# Patient Record
Sex: Male | Born: 1965 | Race: Black or African American | Hispanic: No | Marital: Single | Smoking: Former smoker
Health system: Southern US, Community
[De-identification: ages and names within clinical notes are randomized; demographics above are authoritative.]

## PROBLEM LIST (undated history)

## (undated) DIAGNOSIS — J439 Emphysema, unspecified: Secondary | ICD-10-CM

## (undated) DIAGNOSIS — A150 Tuberculosis of lung: Secondary | ICD-10-CM

---

## 2004-07-17 ENCOUNTER — Ambulatory Visit: Payer: Self-pay | Admitting: Cardiology

## 2016-09-10 ENCOUNTER — Emergency Department (HOSPITAL_COMMUNITY)
Admission: EM | Admit: 2016-09-10 | Discharge: 2016-09-11 | Disposition: A | Discharge status: Home / Self Care | Payer: Self-pay | Attending: Emergency Medicine | Admitting: Emergency Medicine

## 2016-09-10 ENCOUNTER — Emergency Department (HOSPITAL_COMMUNITY): Payer: Self-pay

## 2016-09-10 ENCOUNTER — Encounter (HOSPITAL_COMMUNITY): Payer: Self-pay | Admitting: Emergency Medicine

## 2016-09-10 DIAGNOSIS — Y999 Unspecified external cause status: Secondary | ICD-10-CM | POA: Insufficient documentation

## 2016-09-10 DIAGNOSIS — F17200 Nicotine dependence, unspecified, uncomplicated: Secondary | ICD-10-CM | POA: Insufficient documentation

## 2016-09-10 DIAGNOSIS — Y939 Activity, unspecified: Secondary | ICD-10-CM | POA: Insufficient documentation

## 2016-09-10 DIAGNOSIS — Y92410 Unspecified street and highway as the place of occurrence of the external cause: Secondary | ICD-10-CM | POA: Insufficient documentation

## 2016-09-10 DIAGNOSIS — S63502A Unspecified sprain of left wrist, initial encounter: Secondary | ICD-10-CM | POA: Insufficient documentation
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
## 2016-09-11 MED ORDER — IBUPROFEN 800 MG PO TABS
800.0000 mg | ORAL_TABLET | Freq: Once | ORAL | Status: AC
  Administered 2016-09-11: 800 mg via ORAL
  Filled 2016-09-11: qty 1

## 2016-09-11 NOTE — Discharge Instructions (Signed)
Please return to the Emergency Department for new or worsening symptoms. Please rest your wrist, apply ice, and wear your brace. You may also take anti-inflammatories such as ibuprofen for pain. Please call [facility] for follow up if pain persists.

## 2018-05-16 IMAGING — CR DG WRIST COMPLETE 3+V*L*
4 series · 4 of 4 positions shown · non-contrast
Comparison: None.

CLINICAL DATA: Initial evaluation for acute trauma, fall.

EXAM:
LEFT WRIST - COMPLETE 3+ VIEW

[wrist pa]
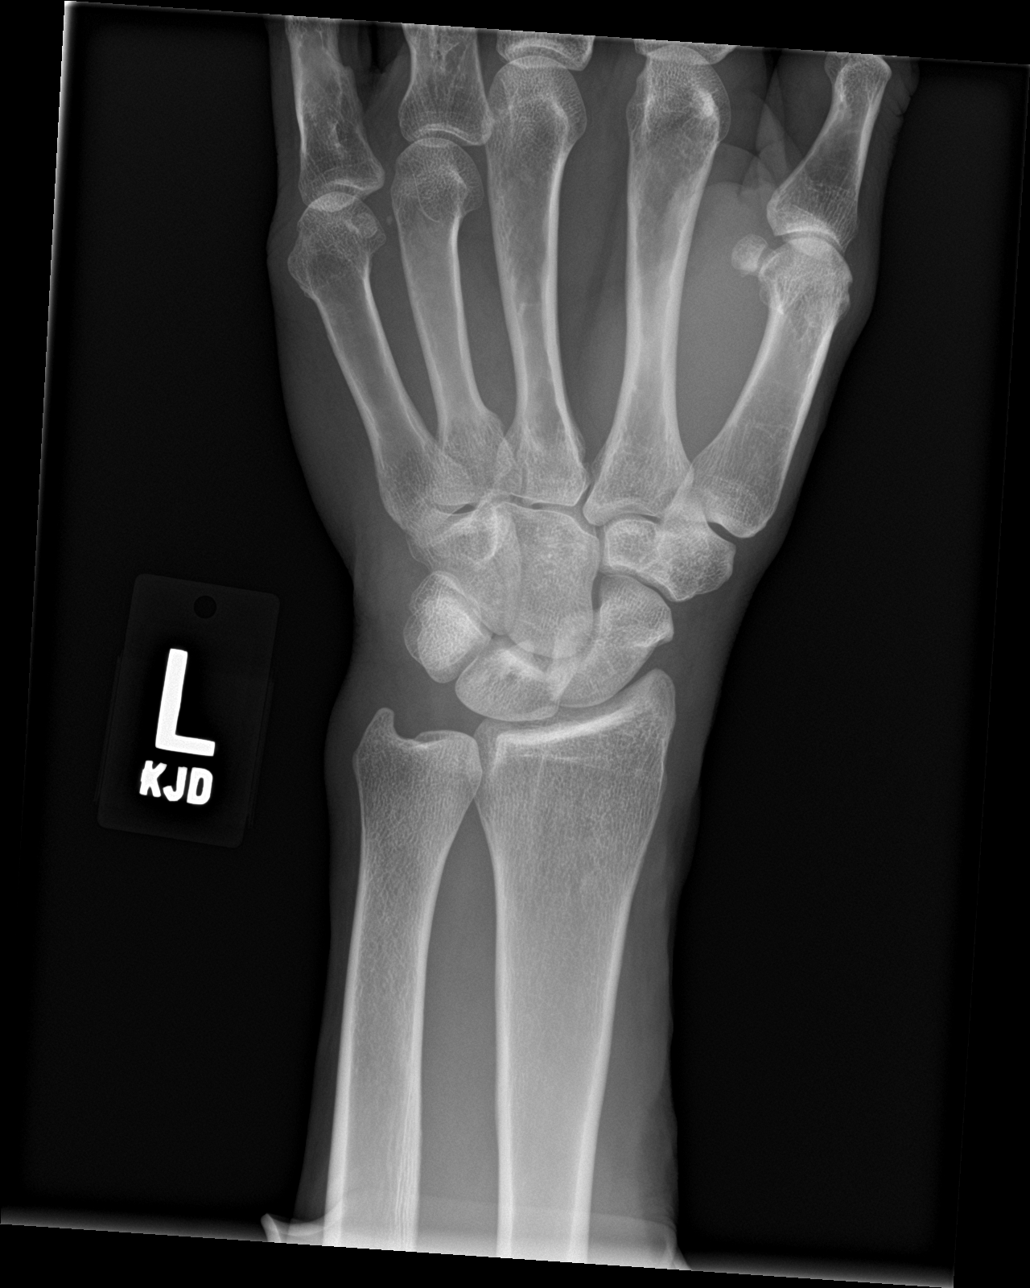

[wrist obl]
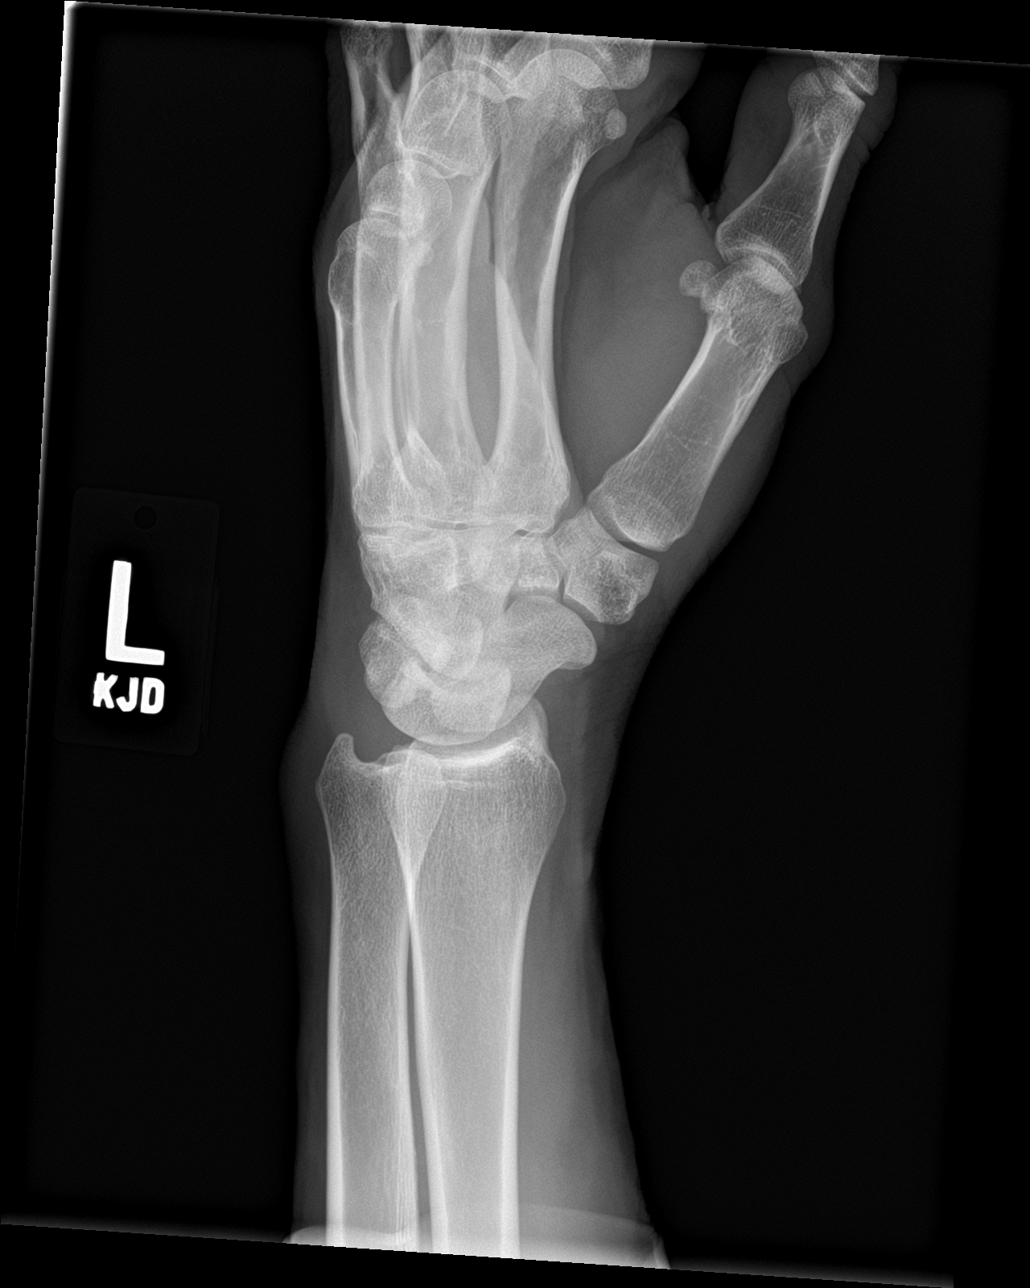

[wrist lat]
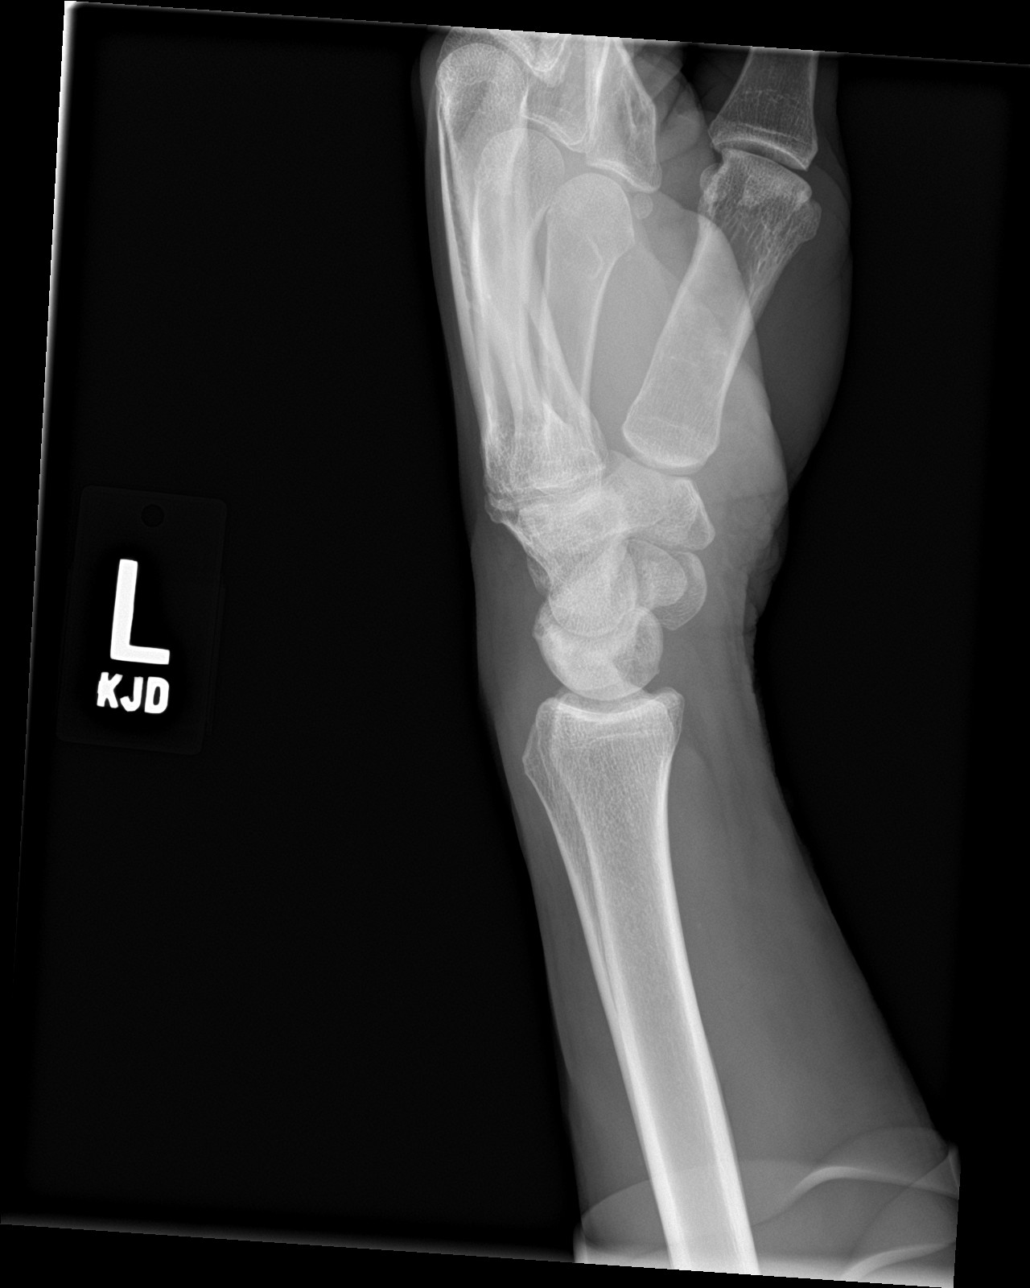

[wrist navicular]
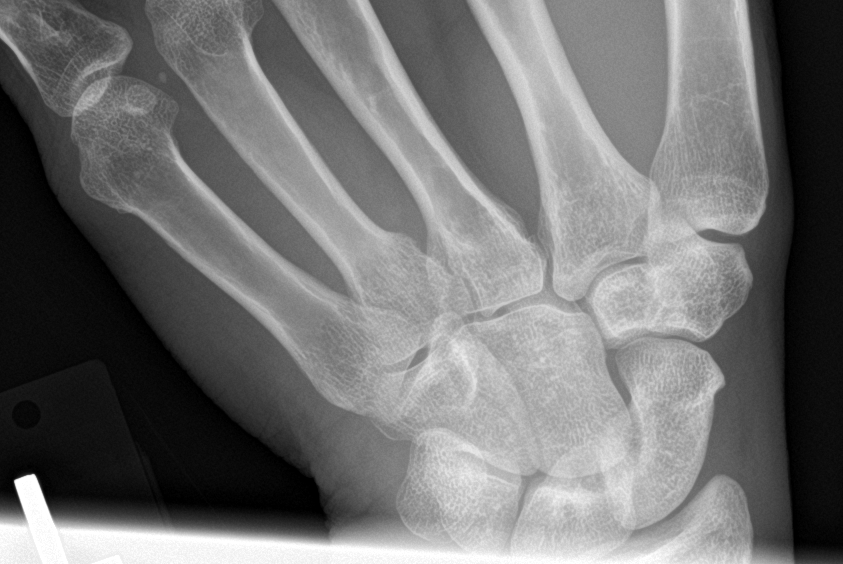

[4 of 4 positions shown; findings below may reference images not displayed]

FINDINGS: No acute fracture or dislocation. Normal distal radioulnar and
radiocarpal articulations intact. Osseous mineralization normal.
Mild focal soft tissue swelling at the distal ulna.
IMPRESSION: 1. No acute fracture or dislocation.
2. Mild soft tissue swelling overlying the distal ulna.

## 2020-11-27 ENCOUNTER — Encounter (HOSPITAL_COMMUNITY): Payer: Self-pay | Admitting: Emergency Medicine

## 2020-11-27 ENCOUNTER — Other Ambulatory Visit: Payer: Self-pay

## 2020-11-27 ENCOUNTER — Emergency Department (HOSPITAL_COMMUNITY): Payer: Self-pay

## 2020-11-27 ENCOUNTER — Emergency Department (HOSPITAL_COMMUNITY)
Admission: EM | Admit: 2020-11-27 | Discharge: 2020-11-28 | Disposition: A | Discharge status: Home / Self Care | Payer: Self-pay | Attending: Emergency Medicine | Admitting: Emergency Medicine

## 2020-11-27 DIAGNOSIS — F17200 Nicotine dependence, unspecified, uncomplicated: Secondary | ICD-10-CM | POA: Insufficient documentation

## 2020-11-27 DIAGNOSIS — R5383 Other fatigue: Secondary | ICD-10-CM

## 2020-11-27 DIAGNOSIS — J069 Acute upper respiratory infection, unspecified: Secondary | ICD-10-CM

## 2020-11-27 DIAGNOSIS — Z20822 Contact with and (suspected) exposure to covid-19: Secondary | ICD-10-CM | POA: Insufficient documentation

## 2020-11-27 LAB — RESP PANEL BY RT-PCR (FLU A&B, COVID) ARPGX2
Influenza A by PCR: NEGATIVE
Influenza B by PCR: NEGATIVE
SARS Coronavirus 2 by RT PCR: NEGATIVE

## 2020-11-27 MED ORDER — ACETAMINOPHEN 500 MG PO TABS
1000.0000 mg | ORAL_TABLET | Freq: Once | ORAL | Status: AC
  Administered 2020-11-28: 1000 mg via ORAL
  Filled 2020-11-27: qty 2

## 2020-11-27 MED ORDER — ALBUTEROL SULFATE HFA 108 (90 BASE) MCG/ACT IN AERS
4.0000 | INHALATION_SPRAY | Freq: Once | RESPIRATORY_TRACT | Status: AC
  Administered 2020-11-28: 4 via RESPIRATORY_TRACT
  Filled 2020-11-27: qty 6.7

## 2020-11-27 MED ORDER — DOXYCYCLINE HYCLATE 100 MG PO TABS
100.0000 mg | ORAL_TABLET | Freq: Once | ORAL | Status: AC
  Administered 2020-11-28: 100 mg via ORAL
  Filled 2020-11-27: qty 1

## 2020-11-27 NOTE — ED Triage Notes (Signed)
Patient brought in ambulatory by [facility] c/o general malaise, loss of appetite and cough x 3 days. Given 1 gram tylenol PTA. Reports negative home covid test.

## 2020-11-27 NOTE — ED Provider Notes (Signed)
Emergency Medicine Provider Triage Evaluation Note  Kenneth Hamilton , a 55 y.o. male  was evaluated in triage.  Pt complains of fatigue, cough and decreased appetite.  Symptoms have been going on for few days.  Could not tolerate his chicken yesterday.  Review of Systems  Positive: Fatigue, cough, fever Negative: Chest pain  Physical Exam  BP 139/84   Pulse (!) 112   Temp (!) 100.8 F (38.2 C) (Oral)   Resp 16   Ht 6' (1.829 m)   Wt 72.6 kg   SpO2 97%   BMI 21.70 kg/m  Gen:   Awake, no distress   Resp:  Normal effort  MSK:   Moves extremities without difficulty  Other:  Speaking complete sentences without signs of respiratory distress  Medical Decision Making  Medically screening exam initiated at 6:56 PM.  Appropriate orders placed.  Kenneth Hamilton was informed that the remainder of the evaluation will be completed by another provider, this initial triage assessment does not replace that evaluation, and the importance of remaining in the ED until their evaluation is complete.  Febrile here but given Tylenol by EMS   [name], PA-C 11/27/20 1859    Koleen Distance, MD 11/27/20 [phone]

## 2020-11-27 NOTE — ED Provider Notes (Signed)
[facility] EMERGENCY DEPARTMENT Provider Note   CSN: [id number] Arrival date & time: 11/27/20  1851     History Chief Complaint  Patient presents with   Fatigue    Kenneth Hamilton is a 55 y.o. male.  55 yo M here with fatigue and fever with cough for last 3 days. Generalized weakness as well. Somewhat productive cough. No known sick contacts. Does smoke. No significant urinary symptoms. Decreased PO but is hungry now. Has abdominal discomfort associated with the hunger but no other abdominal pain. Chest hurts with cough only.        History reviewed. No pertinent past medical history.  There are no problems to display for this patient.   History reviewed. No pertinent surgical history.     No family history on file.  Social History   Tobacco Use   Smoking status: Some Days    Pack years: 0.00   Smokeless tobacco: Never  Substance Use Topics   Alcohol use: No   Drug use: Yes    Types: Cocaine    Home Medications Prior to Admission medications   Medication Sig Start Date End Date Taking? Authorizing Provider  doxycycline (VIBRAMYCIN) 100 MG capsule Take 1 capsule (100 mg total) by mouth 2 (two) times daily. One po bid x 7 days 11/28/20  Yes Sumaiyah Markert, Barbara Cower, MD    Allergies    Patient has no known allergies.  Review of Systems   Review of Systems  Physical Exam Updated Vital Signs BP 131/85 (BP Location: Left Arm)   Pulse 60   Temp 98.9 F (37.2 C) (Oral)   Resp 16   Ht 6' (1.829 m)   Wt 72.6 kg   SpO2 96%   BMI 21.70 kg/m   Physical Exam  ED Results / Procedures / Treatments   Labs (all labs ordered are listed, but only abnormal results are displayed) Labs Reviewed  COMPREHENSIVE METABOLIC PANEL - Abnormal; Notable for the following components:      Result Value   CO2 20 (*)    Glucose, Bld 122 (*)    Calcium 8.6 (*)    All other components within normal limits  RESP PANEL BY RT-PCR (FLU A&B, COVID) ARPGX2  CBC WITH  DIFFERENTIAL/PLATELET  URINALYSIS, ROUTINE W REFLEX MICROSCOPIC    EKG None  Radiology DG Chest Portable 1 View  Result Date: 11/27/2020 CLINICAL DATA:  Fatigue, cough, and decreased appetite for a few days. EXAM: PORTABLE CHEST 1 VIEW COMPARISON:  None. FINDINGS: Heart size and pulmonary vascularity are normal. Emphysematous changes in the lungs. No airspace disease or consolidation. No pleural effusions. No pneumothorax. Mediastinal contours appear intact. IMPRESSION: Emphysematous changes in the lungs. No evidence of active pulmonary disease. Electronically Signed   By: Burman Nieves M.D.   On: 11/27/2020 20:27    Procedures Procedures   Medications Ordered in ED Medications  acetaminophen (TYLENOL) tablet 1,000 mg (1,000 mg Oral Given 11/28/20 0044)  albuterol (VENTOLIN HFA) 108 (90 Base) MCG/ACT inhaler 4 puff (4 puffs Inhalation Given 11/28/20 0011)  doxycycline (VIBRA-TABS) tablet 100 mg (100 mg Oral Given 11/28/20 0044)    ED Course  I have reviewed the triage vital signs and the nursing notes.  Pertinent labs & imaging results that were available during my care of the patient were reviewed by me and considered in my medical decision making (see chart for details).    MDM Rules/Calculators/A&P  Suspect some type of viral illness but does have productive cough and fever for a few days with emphysema. Cough somewhat improved with albuterol. Abx started. No hypoxia, resp distress or e/o sepsis to suggest need for further evaluation or hospital admission.   Final Clinical Impression(s) / ED Diagnoses Final diagnoses:  Other fatigue  Upper respiratory tract infection, unspecified type    Rx / DC Orders ED Discharge Orders          Ordered    doxycycline (VIBRAMYCIN) 100 MG capsule  2 times daily        11/28/20 0343             Demetries Coia, Barbara Cower, MD 11/28/20 [phone]

## 2020-11-28 LAB — CBC WITH DIFFERENTIAL/PLATELET
Abs Immature Granulocytes: 0.02 10*3/uL (ref 0.00–0.07)
Basophils Absolute: 0 10*3/uL (ref 0.0–0.1)
Basophils Relative: 1 %
Eosinophils Absolute: 0 10*3/uL (ref 0.0–0.5)
Eosinophils Relative: 1 %
HCT: 49.8 % (ref 39.0–52.0)
Hemoglobin: 17 g/dL (ref 13.0–17.0)
Immature Granulocytes: 0 %
Lymphocytes Relative: 22 %
Lymphs Abs: 1.4 10*3/uL (ref 0.7–4.0)
MCH: 30 pg (ref 26.0–34.0)
MCHC: 34.1 g/dL (ref 30.0–36.0)
MCV: 87.8 fL (ref 80.0–100.0)
Monocytes Absolute: 0.7 10*3/uL (ref 0.1–1.0)
Monocytes Relative: 12 %
Neutro Abs: 4.1 10*3/uL (ref 1.7–7.7)
Neutrophils Relative %: 64 %
Platelets: UNDETERMINED 10*3/uL (ref 150–400)
RBC: 5.67 MIL/uL (ref 4.22–5.81)
RDW: 13.7 % (ref 11.5–15.5)
WBC: 6.4 10*3/uL (ref 4.0–10.5)
nRBC: 0 % (ref 0.0–0.2)

## 2020-11-28 LAB — COMPREHENSIVE METABOLIC PANEL
ALT: 21 U/L (ref 0–44)
AST: 35 U/L (ref 15–41)
Albumin: 3.6 g/dL (ref 3.5–5.0)
Alkaline Phosphatase: 38 U/L (ref 38–126)
Anion gap: 10 (ref 5–15)
BUN: 9 mg/dL (ref 6–20)
CO2: 20 mmol/L — ABNORMAL LOW (ref 22–32)
Calcium: 8.6 mg/dL — ABNORMAL LOW (ref 8.9–10.3)
Chloride: 105 mmol/L (ref 98–111)
Creatinine, Ser: 1.24 mg/dL (ref 0.61–1.24)
GFR, Estimated: >60 mL/min (ref >60)
Glucose, Bld: 122 mg/dL — ABNORMAL HIGH (ref 70–99)
Potassium: 4.1 mmol/L (ref 3.5–5.1)
Sodium: 135 mmol/L (ref 135–145)
Total Bilirubin: 0.6 mg/dL (ref 0.3–1.2)
Total Protein: 6.8 g/dL (ref 6.5–8.1)

## 2020-11-28 MED ORDER — DOXYCYCLINE HYCLATE 100 MG PO CAPS: 100.0000 mg | ORAL_CAPSULE | Freq: Two times a day (BID) | ORAL | 0 refills | Status: DC

## 2020-11-28 NOTE — ED Notes (Signed)
Patient verbalizes understanding of discharge instructions. Prescriptions reviewed. Opportunity for questioning and answers were provided. Armband removed by staff, pt discharged from ED ambulatory. Bus pass provided.

## 2020-11-30 ENCOUNTER — Emergency Department (HOSPITAL_COMMUNITY): Payer: Self-pay

## 2020-11-30 ENCOUNTER — Inpatient Hospital Stay (HOSPITAL_COMMUNITY)
Admission: EM | Admit: 2020-11-30 | Discharge: 2020-12-02 | DRG: 871 | Disposition: A | Discharge status: Home / Self Care | Payer: Self-pay | Attending: Family Medicine | Admitting: Family Medicine

## 2020-11-30 ENCOUNTER — Encounter (HOSPITAL_COMMUNITY): Payer: Self-pay | Admitting: Emergency Medicine

## 2020-11-30 ENCOUNTER — Other Ambulatory Visit: Payer: Self-pay

## 2020-11-30 DIAGNOSIS — Z79899 Other long term (current) drug therapy: Secondary | ICD-10-CM

## 2020-11-30 DIAGNOSIS — J189 Pneumonia, unspecified organism: Secondary | ICD-10-CM | POA: Diagnosis present

## 2020-11-30 DIAGNOSIS — Z8611 Personal history of tuberculosis: Secondary | ICD-10-CM

## 2020-11-30 DIAGNOSIS — R630 Anorexia: Secondary | ICD-10-CM | POA: Diagnosis present

## 2020-11-30 DIAGNOSIS — Z20822 Contact with and (suspected) exposure to covid-19: Secondary | ICD-10-CM | POA: Diagnosis present

## 2020-11-30 DIAGNOSIS — A419 Sepsis, unspecified organism: Principal | ICD-10-CM | POA: Diagnosis present

## 2020-11-30 DIAGNOSIS — Z6821 Body mass index (BMI) 21.0-21.9, adult: Secondary | ICD-10-CM

## 2020-11-30 DIAGNOSIS — J439 Emphysema, unspecified: Secondary | ICD-10-CM

## 2020-11-30 DIAGNOSIS — F17200 Nicotine dependence, unspecified, uncomplicated: Secondary | ICD-10-CM | POA: Diagnosis present

## 2020-11-30 DIAGNOSIS — Z8249 Family history of ischemic heart disease and other diseases of the circulatory system: Secondary | ICD-10-CM

## 2020-11-30 DIAGNOSIS — Z833 Family history of diabetes mellitus: Secondary | ICD-10-CM

## 2020-11-30 HISTORY — DX: Tuberculosis of lung: A15.0

## 2020-11-30 HISTORY — DX: Pneumonia, unspecified organism: J18.9

## 2020-11-30 HISTORY — DX: Emphysema, unspecified: J43.9

## 2020-11-30 LAB — CBC WITH DIFFERENTIAL/PLATELET
Abs Immature Granulocytes: 0.05 10*3/uL (ref 0.00–0.07)
Basophils Absolute: 0 10*3/uL (ref 0.0–0.1)
Basophils Relative: 0 %
Eosinophils Absolute: 0 10*3/uL (ref 0.0–0.5)
Eosinophils Relative: 0 %
HCT: 46.8 % (ref 39.0–52.0)
Hemoglobin: 16.1 g/dL (ref 13.0–17.0)
Immature Granulocytes: 0 %
Lymphocytes Relative: 7 %
Lymphs Abs: 0.8 10*3/uL (ref 0.7–4.0)
MCH: 29.6 pg (ref 26.0–34.0)
MCHC: 34.4 g/dL (ref 30.0–36.0)
MCV: 86 fL (ref 80.0–100.0)
Monocytes Absolute: 0.8 10*3/uL (ref 0.1–1.0)
Monocytes Relative: 7 %
Neutro Abs: 9.8 10*3/uL — ABNORMAL HIGH (ref 1.7–7.7)
Neutrophils Relative %: 86 %
Platelets: 153 10*3/uL (ref 150–400)
RBC: 5.44 MIL/uL (ref 4.22–5.81)
RDW: 13.4 % (ref 11.5–15.5)
WBC: 11.5 10*3/uL — ABNORMAL HIGH (ref 4.0–10.5)
nRBC: 0 % (ref 0.0–0.2)

## 2020-11-30 LAB — URINALYSIS, ROUTINE W REFLEX MICROSCOPIC
Bilirubin Urine: NEGATIVE
Glucose, UA: NEGATIVE mg/dL
Ketones, ur: NEGATIVE mg/dL
Nitrite: NEGATIVE
Protein, ur: NEGATIVE mg/dL
Specific Gravity, Urine: 1.015 (ref 1.005–1.030)
pH: 5 (ref 5.0–8.0)

## 2020-11-30 LAB — PROCALCITONIN: Procalcitonin: 1.13 ng/mL

## 2020-11-30 LAB — COMPREHENSIVE METABOLIC PANEL
ALT: 29 U/L (ref 0–44)
AST: 40 U/L (ref 15–41)
Albumin: 3.4 g/dL — ABNORMAL LOW (ref 3.5–5.0)
Alkaline Phosphatase: 42 U/L (ref 38–126)
Anion gap: 9 (ref 5–15)
BUN: 15 mg/dL (ref 6–20)
CO2: 26 mmol/L (ref 22–32)
Calcium: 9 mg/dL (ref 8.9–10.3)
Chloride: 103 mmol/L (ref 98–111)
Creatinine, Ser: 1.26 mg/dL — ABNORMAL HIGH (ref 0.61–1.24)
GFR, Estimated: >60 mL/min (ref >60)
Glucose, Bld: 112 mg/dL — ABNORMAL HIGH (ref 70–99)
Potassium: 4.1 mmol/L (ref 3.5–5.1)
Sodium: 138 mmol/L (ref 135–145)
Total Bilirubin: 0.8 mg/dL (ref 0.3–1.2)
Total Protein: 7.4 g/dL (ref 6.5–8.1)

## 2020-11-30 LAB — HIV ANTIBODY (ROUTINE TESTING W REFLEX): HIV Screen 4th Generation wRfx: NONREACTIVE

## 2020-11-30 LAB — RESP PANEL BY RT-PCR (FLU A&B, COVID) ARPGX2
Influenza A by PCR: NEGATIVE
Influenza B by PCR: NEGATIVE
SARS Coronavirus 2 by RT PCR: NEGATIVE

## 2020-11-30 LAB — LACTIC ACID, PLASMA
Lactic Acid, Venous: 2 mmol/L (ref 0.5–1.9)
Lactic Acid, Venous: 2 mmol/L (ref 0.5–1.9)

## 2020-11-30 MED ORDER — SODIUM CHLORIDE 0.9 % IV SOLN
500.0000 mg | Freq: Once | INTRAVENOUS | Status: AC
  Administered 2020-11-30: 500 mg via INTRAVENOUS
  Filled 2020-11-30: qty 500

## 2020-11-30 MED ORDER — SODIUM CHLORIDE 0.9 % IV SOLN
2.0000 g | INTRAVENOUS | Status: DC
  Administered 2020-12-01: 2 g via INTRAVENOUS
  Filled 2020-11-30: qty 2
  Filled 2020-11-30 (×2): qty 20

## 2020-11-30 MED ORDER — SODIUM CHLORIDE 0.45 % IV SOLN: INTRAVENOUS | Status: DC

## 2020-11-30 MED ORDER — METOPROLOL TARTRATE 5 MG/5ML IV SOLN: 2.5000 mg | INTRAVENOUS | Status: DC | PRN

## 2020-11-30 MED ORDER — SODIUM CHLORIDE 0.9 % IV SOLN
1.0000 g | Freq: Once | INTRAVENOUS | Status: AC
  Administered 2020-11-30: 1 g via INTRAVENOUS
  Filled 2020-11-30: qty 10

## 2020-11-30 MED ORDER — SODIUM CHLORIDE 0.9 % IV BOLUS
500.0000 mL | Freq: Once | INTRAVENOUS | Status: AC
  Administered 2020-11-30: 500 mL via INTRAVENOUS

## 2020-11-30 MED ORDER — ACETAMINOPHEN 160 MG/5ML PO SOLN: 320.0000 mg | Freq: Four times a day (QID) | ORAL | Status: DC | PRN

## 2020-11-30 MED ORDER — AZITHROMYCIN 250 MG PO TABS
500.0000 mg | ORAL_TABLET | Freq: Every day | ORAL | Status: DC
  Administered 2020-12-01 – 2020-12-02 (×2): 500 mg via ORAL
  Filled 2020-11-30 (×2): qty 2

## 2020-11-30 MED ORDER — ALBUTEROL SULFATE (2.5 MG/3ML) 0.083% IN NEBU: 2.5000 mg | INHALATION_SOLUTION | Freq: Four times a day (QID) | RESPIRATORY_TRACT | Status: DC | PRN

## 2020-11-30 MED ORDER — ENOXAPARIN SODIUM 40 MG/0.4ML IJ SOSY
40.0000 mg | PREFILLED_SYRINGE | INTRAMUSCULAR | Status: DC
  Administered 2020-11-30 – 2020-12-01 (×2): 40 mg via SUBCUTANEOUS
  Filled 2020-11-30 (×2): qty 0.4

## 2020-11-30 MED ORDER — ACETAMINOPHEN 325 MG PO TABS
650.0000 mg | ORAL_TABLET | Freq: Once | ORAL | Status: AC | PRN
  Administered 2020-11-30: 650 mg via ORAL
  Filled 2020-11-30: qty 2

## 2020-11-30 MED ORDER — NICOTINE 14 MG/24HR TD PT24
14.0000 mg | MEDICATED_PATCH | Freq: Every day | TRANSDERMAL | Status: DC
  Administered 2020-11-30 – 2020-12-02 (×3): 14 mg via TRANSDERMAL
  Filled 2020-11-30 (×3): qty 1

## 2020-11-30 MED ORDER — GUAIFENESIN-DM 100-10 MG/5ML PO SYRP
5.0000 mL | ORAL_SOLUTION | ORAL | Status: DC | PRN
  Administered 2020-11-30: 5 mL via ORAL
  Filled 2020-11-30: qty 5

## 2020-11-30 NOTE — ED Notes (Signed)
Pt ambulated 4x in room with an O2 96%

## 2020-11-30 NOTE — ED Provider Notes (Signed)
[facility] Provider Note   CSN: [id number] Arrival date & time: 11/30/20  1635     History No chief complaint on file.   Kenneth Hamilton is a 55 y.o. male.  Patient is a 55 year old male who presents with cough and fever.  He states he has had a cough and fever for about the last 4 to 5 days.  He was seen here 3 days ago for similar symptoms.  He has a cough which is productive of phlegm and fevers associated with generalized weakness.  He has had some nausea but no vomiting.  Said a little bit of loose stools.  He reports some shortness of breath after ambulation.  On his prior visit, he was given a prescription for doxycycline and albuterol inhaler.  He has been using albuterol inhaler but he said when he went to get the prescription the pharmacy was closed and he has not been able to get the doxycycline.  He says overall he has been feeling worse and has diffuse generalized weakness.  He said he is sleeping on a blowup mattress because it is closer to the bathroom he does not have to walk as far.  He can only walk short distances.  He does not have much of an appetite.  He has a smoker but has not smoked since this illness started.      History reviewed. No pertinent past medical history.  There are no problems to display for this patient.   History reviewed. No pertinent surgical history.     No family history on file.  Social History   Tobacco Use   Smoking status: Some Days    Pack years: 0.00   Smokeless tobacco: Never  Substance Use Topics   Alcohol use: No   Drug use: Yes    Types: Cocaine    Home Medications Prior to Admission medications   Medication Sig Start Date End Date Taking? Authorizing Provider  acetaminophen (TYLENOL) 160 MG/5ML solution Take 320 mg by mouth every 6 (six) hours as needed for fever.   Yes [provider]  albuterol (VENTOLIN HFA) 108 (90 Base) MCG/ACT inhaler Inhale 2 puffs into the  lungs every 6 (six) hours as needed for wheezing or shortness of breath.   Yes [provider]  doxycycline (VIBRAMYCIN) 100 MG capsule Take 1 capsule (100 mg total) by mouth 2 (two) times daily. One po bid x 7 days 11/28/20   Mesner, Barbara Cower, MD    Allergies    Patient has no known allergies.  Review of Systems   Review of Systems  Constitutional:  Positive for fatigue and fever. Negative for chills and diaphoresis.  HENT:  Positive for congestion and rhinorrhea. Negative for sneezing.   Eyes: Negative.   Respiratory:  Positive for cough and shortness of breath. Negative for chest tightness.   Cardiovascular:  Negative for chest pain and leg swelling.  Gastrointestinal:  Positive for nausea. Negative for abdominal pain, blood in stool, diarrhea and vomiting.  Genitourinary:  Negative for difficulty urinating, flank pain, frequency and hematuria.  Musculoskeletal:  Negative for arthralgias and back pain.  Skin:  Negative for rash.  Neurological:  Positive for weakness (Generalized). Negative for dizziness, speech difficulty, numbness and headaches.   Physical Exam Updated Vital Signs BP (!) 144/110 (BP Location: Right Arm)   Pulse 88   Temp 98.8 F (37.1 C) (Oral)   Resp 16   Ht 6' (1.829 m)   Wt 72.6  kg   SpO2 93%   BMI 21.70 kg/m   Physical Exam Constitutional:      Appearance: He is well-developed.  HENT:     Head: Normocephalic and atraumatic.  Eyes:     Pupils: Pupils are equal, round, and reactive to light.  Cardiovascular:     Rate and Rhythm: Regular rhythm. Tachycardia present.     Heart sounds: Normal heart sounds.  Pulmonary:     Effort: Pulmonary effort is normal. No respiratory distress.     Breath sounds: Wheezing (Few wheezes bilaterally) present. No rales.  Chest:     Chest wall: No tenderness.  Abdominal:     General: Bowel sounds are normal.     Palpations: Abdomen is soft.     Tenderness: There is no abdominal tenderness. There is no guarding  or rebound.  Musculoskeletal:        General: Normal range of motion.     Cervical back: Normal range of motion and neck supple.     Comments: No edema or calf tenderness  Lymphadenopathy:     Cervical: No cervical adenopathy.  Skin:    General: Skin is warm and dry.     Findings: No rash.  Neurological:     Mental Status: He is alert and oriented to person, place, and time.    ED Results / Procedures / Treatments   Labs (all labs ordered are listed, but only abnormal results are displayed) Labs Reviewed  LACTIC ACID, PLASMA - Abnormal; Notable for the following components:      Result Value   Lactic Acid, Venous 2.0 (*)    All other components within normal limits  COMPREHENSIVE METABOLIC PANEL - Abnormal; Notable for the following components:   Glucose, Bld 112 (*)    Creatinine, Ser 1.26 (*)    Albumin 3.4 (*)    All other components within normal limits  CBC WITH DIFFERENTIAL/PLATELET - Abnormal; Notable for the following components:   WBC 11.5 (*)    Neutro Abs 9.8 (*)    All other components within normal limits  RESP PANEL BY RT-PCR (FLU A&B, COVID) ARPGX2  LACTIC ACID, PLASMA  URINALYSIS, ROUTINE W REFLEX MICROSCOPIC    EKG None  Radiology DG Chest 2 View  Result Date: 11/30/2020 CLINICAL DATA:  Shortness of breath and fever EXAM: CHEST - 2 VIEW COMPARISON:  November 27, 2020 FINDINGS: The heart size and mediastinal contours are within normal limits. Left greater than right multifocal interstitial opacities. No pleural effusion. No pneumothorax. Emphysematous change with pulmonary hyperinflation. The visualized skeletal structures are unremarkable. IMPRESSION: Chronic parenchymal lung changes with superimposed, multifocal interstitial opacities, suspicious for atypical/viral infection. Electronically Signed   By: Maudry Mayhew MD   On: 11/30/2020 18:01    Procedures Procedures   Medications Ordered in ED Medications  cefTRIAXone (ROCEPHIN) 1 g in sodium chloride  0.9 % 100 mL IVPB (has no administration in time range)  azithromycin (ZITHROMAX) 500 mg in sodium chloride 0.9 % 250 mL IVPB (has no administration in time range)  sodium chloride 0.9 % bolus 500 mL (has no administration in time range)  acetaminophen (TYLENOL) tablet 650 mg (650 mg Oral Given 11/30/20 1713)    ED Course  I have reviewed the triage vital signs and the nursing notes.  Pertinent labs & imaging results that were available during my care of the patient were reviewed by me and considered in my medical decision making (see chart for details).    MDM Rules/Calculators/A&P  Patient is a 55 year old male who presents with worsening cough and fevers.  He has generalized weakness and says that he can only walk short distances without becoming short of breath.  He does not have overt hypoxia.  His oxygen saturations dropped down to about 91 to 92% after ambulation.  His chest x-ray shows worsening multifocal pneumonia, possibly atypical versus viral.  His COVID and flu test are negative.  Patient does not feel that he is strong enough to go home.  Will consult hospitalist for admission.  I spoke with Dr. Debby Bud who will admit the pt.  He was started on Rocephin and zithromax. Final Clinical Impression(s) / ED Diagnoses Final diagnoses:  Community acquired pneumonia, unspecified laterality    Rx / DC Orders ED Discharge Orders     None        Rolan Bucco, MD 11/30/20 [phone]

## 2020-11-30 NOTE — ED Triage Notes (Addendum)
Pt to triage via [facility] from home.  Reports SOB, sore throat, cough, and fever x 5 days.  Seen in ED on Thursday and states he isn't feeling any better.

## 2020-11-30 NOTE — ED Notes (Signed)
Attempted report x1. 

## 2020-11-30 NOTE — Progress Notes (Signed)
Patient arrived to 2H85, AX0X4, able to make needs known. No acute distress noted. No SOB/DOB. 99%RA. Denies any pain. VSS T99.0 BP113/77 PR82 RR17. Patient was oriented to call bell and surroundings. Call bell within reach and will continue to monitor

## 2020-11-30 NOTE — ED Notes (Signed)
Pt stated was unable to get antibiotic because [facility] closed his prescription request.

## 2020-11-30 NOTE — H&P (Signed)
History and Physical    Kenneth Hamilton:[id number] DOB: 1965/10/16 DOA: 11/30/2020  PCP: Pcp, No (Confirm with patient/family/NH records and if not entered, this has to be entered at [facility] point of entry) Patient coming from: home  I have personally briefly reviewed patient's old medical records in [facility]  Chief Complaint: Weakness, cough, loss of appetite  HPI: Kenneth Hamilton is a 55 y.o. male with medical history significant of emphysema presented to [facility] 11/27/20 for cough and weakness. Evaluation was remarkable for nl CXR with findings of emphysematous change, nl WBC and Diff. He was discharged home on Doxycycline. He was unable to fill Rx and his symptoms progressed with increasing weakness, loss of appetite and persistent productive cough. He presents to [facility] for revaluation.  ED Course: T Max 102.8, at exam 98.3, BP 144/110  HR 88 RR 16 O2 sat 93%. CXR with patchy multi-lobar changes c/w atypical or viral PNA. Covid negative. WBC 11.5 (up from 4.5) with 86/7/7. He was bolused 500 cc NS, administered 1 g Ceftriaxone, 500 mg IV Azithromycin. TRH called to admit for continued treatment.   Review of Systems: As per HPI otherwise 10 point review of systems negative.    Past Medical History:  Diagnosis Date   Emphysema lung (HCC)    Primary atypical pneumonia 11/30/2020   TB (pulmonary tuberculosis)    took oral medication for six months. Occurred years ago    History reviewed. No pertinent surgical history.  Soc Hx - never married. Served time in prison for drug related charges. Currently does odd jobs. Lives with a friend. Has no resources: no MD, no drug coverage, limited finances.   reports that he quit smoking 3 days ago. His smoking use included cigarettes. He has a 40.00 pack-year smoking history. He has never used smokeless tobacco. He reports previous drug use. Drugs: Cocaine, "Crack" cocaine, Heroin, and Marijuana. He reports that he does not drink  alcohol.  No Known Allergies  Family History  Problem Relation Age of Onset   Coronary artery disease Mother    Diabetes Brother      Prior to Admission medications   Medication Sig Start Date End Date Taking? Authorizing Provider  acetaminophen (TYLENOL) 160 MG/5ML solution Take 320 mg by mouth every 6 (six) hours as needed for fever.   Yes [provider]  albuterol (VENTOLIN HFA) 108 (90 Base) MCG/ACT inhaler Inhale 2 puffs into the lungs every 6 (six) hours as needed for wheezing or shortness of breath.   Yes [provider]  doxycycline (VIBRAMYCIN) 100 MG capsule Take 1 capsule (100 mg total) by mouth 2 (two) times daily. One po bid x 7 days 11/28/20   Marily Memos, MD    Physical Exam: Vitals:   11/30/20 1902 11/30/20 1906 11/30/20 1908 11/30/20 1914  BP: (!) 108/97   (!) 144/110  Pulse: 87   88  Resp: 16   16  Temp:   98.8 F (37.1 C)   TempSrc:   Oral   SpO2: 100%   93%  Weight:  72.6 kg    Height:  6' (1.829 m)      Vitals:   11/30/20 1902 11/30/20 1906 11/30/20 1908 11/30/20 1914  BP: (!) 108/97   (!) 144/110  Pulse: 87   88  Resp: 16   16  Temp:   98.8 F (37.1 C)   TempSrc:   Oral   SpO2: 100%   93%  Weight:  72.6 kg  Height:  6' (1.829 m)     General: very thin with notable temporal wasting.  Eyes: PERRL, lids nl  and conjunctivae muddy ENMT: Mucous membranes are moist. Posterior pharynx clear of any exudate or lesions. Poor dentition missing several teeth, has several loose teeth has apparent gum disease. .  Neck: normal, supple, no masses, no thyromegaly Respiratory:  no increased WOB, prolonged expiratory phase, decreased diaphramatic excursion, feint bibasilar rales.  Cardiovascular: Regular rate and rhythm, no murmurs / rubs / gallops. No extremity edema. 2+ pedal pulses. No carotid bruits.  Abdomen: no tenderness, no masses palpated. No hepatosplenomegaly. Bowel sounds positive.  Musculoskeletal: no clubbing / cyanosis. No  joint deformity upper and lower extremities. Good ROM, no contractures. Normal muscle tone.  Skin: no rashes, lesions, ulcers. No induration Neurologic: CN 2-12 grossly intact. Sensation intact, Strength 5/5 in all 4.  Psychiatric: Normal judgment and insight. Alert and oriented x 3. Normal mood.     Labs on Admission: I have personally reviewed following labs and imaging studies  CBC: Recent Labs  Lab 11/27/20 2352 11/30/20 1726  WBC 6.4 11.5*  NEUTROABS 4.1 9.8*  HGB 17.0 16.1  HCT 49.8 46.8  MCV 87.8 86.0  PLT PLATELET CLUMPS NOTED ON SMEAR, UNABLE TO ESTIMATE 153   Basic Metabolic Panel: Recent Labs  Lab 11/27/20 2352 11/30/20 1726  NA 135 138  K 4.1 4.1  CL 105 103  CO2 20* 26  GLUCOSE 122* 112*  BUN 9 15  CREATININE 1.24 1.26*  CALCIUM 8.6* 9.0   GFR: Estimated Creatinine Clearance: 68.8 mL/min (A) (by C-G formula based on SCr of 1.26 mg/dL (H)). Liver Function Tests: Recent Labs  Lab 11/27/20 2352 11/30/20 1726  AST 35 40  ALT 21 29  ALKPHOS 38 42  BILITOT 0.6 0.8  PROT 6.8 7.4  ALBUMIN 3.6 3.4*   No results for input(s): LIPASE, AMYLASE in the last 168 hours. No results for input(s): AMMONIA in the last 168 hours. Coagulation Profile: No results for input(s): INR, PROTIME in the last 168 hours. Cardiac Enzymes: No results for input(s): CKTOTAL, CKMB, CKMBINDEX, TROPONINI in the last 168 hours. BNP (last 3 results) No results for input(s): PROBNP in the last 8760 hours. HbA1C: No results for input(s): HGBA1C in the last 72 hours. CBG: No results for input(s): GLUCAP in the last 168 hours. Lipid Profile: No results for input(s): CHOL, HDL, LDLCALC, TRIG, CHOLHDL, LDLDIRECT in the last 72 hours. Thyroid Function Tests: No results for input(s): TSH, T4TOTAL, FREET4, T3FREE, THYROIDAB in the last 72 hours. Anemia Panel: No results for input(s): VITAMINB12, FOLATE, FERRITIN, TIBC, IRON, RETICCTPCT in the last 72 hours. Urine analysis: No results  found for: COLORURINE, APPEARANCEUR, LABSPEC, PHURINE, GLUCOSEU, HGBUR, BILIRUBINUR, KETONESUR, PROTEINUR, UROBILINOGEN, NITRITE, LEUKOCYTESUR  Radiological Exams on Admission: DG Chest 2 View  Result Date: 11/30/2020 CLINICAL DATA:  Shortness of breath and fever EXAM: CHEST - 2 VIEW COMPARISON:  November 27, 2020 FINDINGS: The heart size and mediastinal contours are within normal limits. Left greater than right multifocal interstitial opacities. No pleural effusion. No pneumothorax. Emphysematous change with pulmonary hyperinflation. The visualized skeletal structures are unremarkable. IMPRESSION: Chronic parenchymal lung changes with superimposed, multifocal interstitial opacities, suspicious for atypical/viral infection. Electronically Signed   By: Maudry Mayhew MD   On: 11/30/2020 18:01    EKG: Independently reviewed. NSR w/o acute changes  Assessment/Plan Active Problems:   Primary atypical pneumonia   Emphysema lung (HCC)   Atypical pneumonia    Pneumonia -  appears atypical but has a leukocytosis with left shift. Prior h/o TB remotely. No cavitary lesions on CXR.  Plan Procalcitonin - baseline  Quantiferon Gold Plus - r/o active TB  Sputum for gram stain and culture  CT chest to better delineate disease process  Continue Abx: Ceftriaxone and Azithromycin  Pulmonary toilet  2. Emphysema - based on chest x-ray No prior diagnosis. Says he quit smoking 5 days ago.  Plan Continue smoking cessation   Nicoderm patch  3. TOC - will need medical followup and assistance with medications  DVT prophylaxis: lovenox  Code Status: full code Family Communication: emergency contact - phone out of service.  Disposition Plan: Home when stable  Consults called: none  Admission status: Obs    Illene Regulus MD [facility] Pager [phone]  If 7PM-7AM, please contact night-coverage [email or web address] Password [facility]  11/30/2020, 8:08 PM

## 2020-12-01 DIAGNOSIS — A419 Sepsis, unspecified organism: Principal | ICD-10-CM

## 2020-12-01 DIAGNOSIS — J439 Emphysema, unspecified: Secondary | ICD-10-CM

## 2020-12-01 NOTE — Progress Notes (Signed)
PROGRESS NOTE    Kenneth Hamilton  NWG:[id number] DOB: August 14, 1965 DOA: 11/30/2020 PCP: Pcp, No   Brief Narrative: Kenneth Hamilton is a 55 y.o. male with a history of TB infection previously treated. Patient presented secondary to weakness, cough and loss of appetite. Patient was found to have evidence of multifocal pneumonia. Ceftriaxone and azithromycin started.    Assessment & Plan:   Principal Problem:   Primary atypical pneumonia Active Problems:   Emphysema lung (HCC)   Atypical pneumonia   Sepsis (HCC)   Multifocal pneumonia Consistent with atypical pneumonia. Started empirically on Ceftriaxone and azithromycin. Procalcitonin elevated at 1.13. Leukocytosis of 11.5k. Tmax of 102.8 F on admission. No blood cultures obtained on admission. -Continue Ceftriaxone and azithromycin -Sputum culture -RVP  Sepsis Present on admission and secondary to above. Antibiotics as mentioned above. No blood cultures obtained on admission.  Emphysema Patient is a prior smoker. Diagnosis is based on imaging. Patient will need PFT as an outpatient. Currently without wheezing/dyspnea. Started on a nicotine patch -Continue nicotine patch  History of TB Previously treated. Quantiferon gold obtained on admission. Discussed ID and recommendation is now to check AFB smear to rule out active infection -Follow-up quantiferon gold test -AFB Smear   DVT prophylaxis: Lovenox Code Status:   Code Status: Full Code Family Communication: None at bedside Disposition Plan: Discharge home likely in 1-3 days pending transition to oral antibiotics   Consultants:  None  Procedures:  None  Antimicrobials: Ceftriaxone Azithromycin    Subjective: No concerns. Productive sputum. No dyspnea or chest pain.  Objective: Vitals:   11/30/20 2030 12/01/20 0056 12/01/20 0444 12/01/20 1153  BP: 113/77 (!) 104/55 108/75 92/67  Pulse: 82 81 74 68  Resp: 17 17 18  (!) 22  Temp: 99 F (37.2 C)  99.8 F (37.7 C) 98.9 F (37.2 C) 97.6 F (36.4 C)  TempSrc: Oral Oral Oral Oral  SpO2: 99% 97% 96% 98%  Weight:      Height:        Intake/Output Summary (Last 24 hours) at 12/01/2020 1246 Last data filed at 12/01/2020 0900 Gross per 24 hour  Intake 840 ml  Output --  Net 840 ml   Filed Weights   11/30/20 1906  Weight: 72.6 kg    Examination:  General exam: Appears calm and comfortable Respiratory system: Diminished breath sounds. Respiratory effort normal. Cardiovascular system: S1 & S2 heard, RRR. No murmurs, rubs, gallops or clicks. Gastrointestinal system: Abdomen is nondistended, soft and nontender. No organomegaly or masses felt. Normal bowel sounds heard. Central nervous system: Alert and oriented. No focal neurological deficits. Musculoskeletal: No edema. No calf tenderness Skin: No cyanosis. No rashes Psychiatry: Judgement and insight appear normal. Mood & affect appropriate.     Data Reviewed: I have personally reviewed following labs and imaging studies  CBC Lab Results  Component Value Date   WBC 11.5 (H) 11/30/2020   RBC 5.44 11/30/2020   HGB 16.1 11/30/2020   HCT 46.8 11/30/2020   MCV 86.0 11/30/2020   MCH 29.6 11/30/2020   PLT 153 11/30/2020   MCHC 34.4 11/30/2020   RDW 13.4 11/30/2020   LYMPHSABS 0.8 11/30/2020   MONOABS 0.8 11/30/2020   EOSABS 0.0 11/30/2020   BASOSABS 0.0 11/30/2020     Last metabolic panel Lab Results  Component Value Date   NA 138 11/30/2020   K 4.1 11/30/2020   CL 103 11/30/2020   CO2 26 11/30/2020   BUN 15 11/30/2020   CREATININE 1.26 (  H) 11/30/2020   GLUCOSE 112 (H) 11/30/2020   GFRNONAA >60 11/30/2020   CALCIUM 9.0 11/30/2020   PROT 7.4 11/30/2020   ALBUMIN 3.4 (L) 11/30/2020   BILITOT 0.8 11/30/2020   ALKPHOS 42 11/30/2020   AST 40 11/30/2020   ALT 29 11/30/2020   ANIONGAP 9 11/30/2020    CBG (last 3)  No results for input(s): GLUCAP in the last 72 hours.   GFR: Estimated Creatinine Clearance:  68.8 mL/min (A) (by C-G formula based on SCr of 1.26 mg/dL (H)).  Coagulation Profile: No results for input(s): INR, PROTIME in the last 168 hours.  Recent Results (from the past 240 hour(s))  Resp Panel by RT-PCR (Flu A&B, Covid) Nasopharyngeal Swab     Status: None   Collection Time: 11/27/20  6:59 PM   Specimen: Nasopharyngeal Swab; Nasopharyngeal(NP) swabs in vial transport medium  Result Value Ref Range Status   SARS Coronavirus 2 by RT PCR NEGATIVE NEGATIVE Final    Comment: (NOTE) SARS-CoV-2 target nucleic acids are NOT DETECTED.  The SARS-CoV-2 RNA is generally detectable in upper respiratory specimens during the acute phase of infection. The lowest concentration of SARS-CoV-2 viral copies this assay can detect is 138 copies/mL. A negative result does not preclude SARS-Cov-2 infection and should not be used as the sole basis for treatment or other patient management decisions. A negative result may occur with  improper specimen collection/handling, submission of specimen other than nasopharyngeal swab, presence of viral mutation(s) within the areas targeted by this assay, and inadequate number of viral copies(<138 copies/mL). A negative result must be combined with clinical observations, patient history, and epidemiological information. The expected result is Negative.  Fact Sheet for Patients:  BloggerCourse.com  Fact Sheet for Healthcare Providers:  SeriousBroker.it  This test is no t yet approved or cleared by the [place] FDA and  has been authorized for detection and/or diagnosis of SARS-CoV-2 by FDA under an Emergency Use Authorization (EUA). This EUA will remain  in effect (meaning this test can be used) for the duration of the COVID-19 declaration under Section 564(b)(1) of the Act, 21 U.S.C.section 360bbb-3(b)(1), unless the authorization is terminated  or revoked sooner.       Influenza A by PCR  NEGATIVE NEGATIVE Final   Influenza B by PCR NEGATIVE NEGATIVE Final    Comment: (NOTE) The Xpert Xpress SARS-CoV-2/FLU/RSV plus assay is intended as an aid in the diagnosis of influenza from Nasopharyngeal swab specimens and should not be used as a sole basis for treatment. Nasal washings and aspirates are unacceptable for Xpert Xpress SARS-CoV-2/FLU/RSV testing.  Fact Sheet for Patients: BloggerCourse.com  Fact Sheet for Healthcare Providers: SeriousBroker.it  This test is not yet approved or cleared by the [place] FDA and has been authorized for detection and/or diagnosis of SARS-CoV-2 by FDA under an Emergency Use Authorization (EUA). This EUA will remain in effect (meaning this test can be used) for the duration of the COVID-19 declaration under Section 564(b)(1) of the Act, 21 U.S.C. section 360bbb-3(b)(1), unless the authorization is terminated or revoked.  Performed at [facility], [address] [place], [place] [address]   Resp Panel by RT-PCR (Flu A&B, Covid) Nasopharyngeal Swab     Status: None   Collection Time: 11/30/20  5:24 PM   Specimen: Nasopharyngeal Swab; Nasopharyngeal(NP) swabs in vial transport medium  Result Value Ref Range Status   SARS Coronavirus 2 by RT PCR NEGATIVE NEGATIVE Final    Comment: (NOTE) SARS-CoV-2 target nucleic acids  are NOT DETECTED.  The SARS-CoV-2 RNA is generally detectable in upper respiratory specimens during the acute phase of infection. The lowest concentration of SARS-CoV-2 viral copies this assay can detect is 138 copies/mL. A negative result does not preclude SARS-Cov-2 infection and should not be used as the sole basis for treatment or other patient management decisions. A negative result may occur with  improper specimen collection/handling, submission of specimen other than nasopharyngeal swab, presence of viral mutation(s) within the areas targeted by this  assay, and inadequate number of viral copies(<138 copies/mL). A negative result must be combined with clinical observations, patient history, and epidemiological information. The expected result is Negative.  Fact Sheet for Patients:  BloggerCourse.com  Fact Sheet for Healthcare Providers:  SeriousBroker.it  This test is no t yet approved or cleared by the [place] FDA and  has been authorized for detection and/or diagnosis of SARS-CoV-2 by FDA under an Emergency Use Authorization (EUA). This EUA will remain  in effect (meaning this test can be used) for the duration of the COVID-19 declaration under Section 564(b)(1) of the Act, 21 U.S.C.section 360bbb-3(b)(1), unless the authorization is terminated  or revoked sooner.       Influenza A by PCR NEGATIVE NEGATIVE Final   Influenza B by PCR NEGATIVE NEGATIVE Final    Comment: (NOTE) The Xpert Xpress SARS-CoV-2/FLU/RSV plus assay is intended as an aid in the diagnosis of influenza from Nasopharyngeal swab specimens and should not be used as a sole basis for treatment. Nasal washings and aspirates are unacceptable for Xpert Xpress SARS-CoV-2/FLU/RSV testing.  Fact Sheet for Patients: BloggerCourse.com  Fact Sheet for Healthcare Providers: SeriousBroker.it  This test is not yet approved or cleared by the [place] FDA and has been authorized for detection and/or diagnosis of SARS-CoV-2 by FDA under an Emergency Use Authorization (EUA). This EUA will remain in effect (meaning this test can be used) for the duration of the COVID-19 declaration under Section 564(b)(1) of the Act, 21 U.S.C. section 360bbb-3(b)(1), unless the authorization is terminated or revoked.  Performed at [facility], [address] [place], [place] [address]         Radiology Studies: DG Chest 2 View  Result Date: 11/30/2020 CLINICAL  DATA:  Shortness of breath and fever EXAM: CHEST - 2 VIEW COMPARISON:  November 27, 2020 FINDINGS: The heart size and mediastinal contours are within normal limits. Left greater than right multifocal interstitial opacities. No pleural effusion. No pneumothorax. Emphysematous change with pulmonary hyperinflation. The visualized skeletal structures are unremarkable. IMPRESSION: Chronic parenchymal lung changes with superimposed, multifocal interstitial opacities, suspicious for atypical/viral infection. Electronically Signed   By: Maudry Mayhew MD   On: 11/30/2020 18:01        Scheduled Meds:  azithromycin  500 mg Oral Daily   enoxaparin (LOVENOX) injection  40 mg Subcutaneous Q24H   nicotine  14 mg Transdermal Daily   Continuous Infusions:  sodium chloride 50 mL/hr at 12/01/20 1014   cefTRIAXone (ROCEPHIN)  IV       LOS: 0 days     Jacquelin Hawking, MD [facility] 12/01/2020, 12:46 PM  If 7PM-7AM, please contact night-coverage [email or web address]

## 2020-12-02 ENCOUNTER — Other Ambulatory Visit (HOSPITAL_COMMUNITY): Payer: Self-pay

## 2020-12-02 LAB — BASIC METABOLIC PANEL
Anion gap: 9 (ref 5–15)
BUN: 8 mg/dL (ref 6–20)
CO2: 19 mmol/L — ABNORMAL LOW (ref 22–32)
Calcium: 8.3 mg/dL — ABNORMAL LOW (ref 8.9–10.3)
Chloride: 109 mmol/L (ref 98–111)
Creatinine, Ser: 0.98 mg/dL (ref 0.61–1.24)
GFR, Estimated: >60 mL/min (ref >60)
Glucose, Bld: 62 mg/dL — ABNORMAL LOW (ref 70–99)
Potassium: 5 mmol/L (ref 3.5–5.1)
Sodium: 137 mmol/L (ref 135–145)

## 2020-12-02 LAB — EXPECTORATED SPUTUM ASSESSMENT W GRAM STAIN, RFLX TO RESP C: Special Requests: NORMAL

## 2020-12-02 LAB — CBC
HCT: 43.5 % (ref 39.0–52.0)
Hemoglobin: 14.8 g/dL (ref 13.0–17.0)
MCH: 29.6 pg (ref 26.0–34.0)
MCHC: 34 g/dL (ref 30.0–36.0)
MCV: 87 fL (ref 80.0–100.0)
Platelets: 203 10*3/uL (ref 150–400)
RBC: 5 MIL/uL (ref 4.22–5.81)
RDW: 13.5 % (ref 11.5–15.5)
WBC: 6.8 10*3/uL (ref 4.0–10.5)
nRBC: 0 % (ref 0.0–0.2)

## 2020-12-02 LAB — ACID FAST SMEAR (AFB, MYCOBACTERIA): Acid Fast Smear: NEGATIVE

## 2020-12-02 MED ORDER — AZITHROMYCIN 500 MG PO TABS
500.0000 mg | ORAL_TABLET | Freq: Every day | ORAL | 0 refills | Status: AC
  Filled 2020-12-02: qty 2, 2d supply, fill #0

## 2020-12-02 MED ORDER — AMOXICILLIN 500 MG PO CAPS
500.0000 mg | ORAL_CAPSULE | Freq: Three times a day (TID) | ORAL | 0 refills | Status: AC
  Filled 2020-12-02: qty 15, 5d supply, fill #0

## 2020-12-02 NOTE — Discharge Summary (Signed)
Physician Discharge Summary  Kenneth Hamilton MLY:[id number] DOB: 07/19/1965 DOA: 11/30/2020  PCP: Pcp, No  Admit date: 11/30/2020 Discharge date: 12/02/2020  Admitted From: Home Disposition: Home  Recommendations for Outpatient Follow-up:  Follow up with PCP in 1 week Please obtain BMP/CBC in one week Please follow up on the following pending results: Sputum culture, quantiferon gold/AFB smear, respiratory virus panel  Home Health: None Equipment/Devices: None  Discharge Condition: Stable CODE STATUS: Full code Diet recommendation: Regular diet   Brief/Interim Summary:  Admission HPI written by Kenneth Regulus, Hamilton   HPI: Kenneth Hamilton is a 55 y.o. male with medical history significant of emphysema presented to [facility] 11/27/20 for cough and weakness. Evaluation was remarkable for nl CXR with findings of emphysematous change, nl WBC and Diff. He was discharged home on Doxycycline. He was unable to fill Rx and his symptoms progressed with increasing weakness, loss of appetite and persistent productive cough. He presents to [facility] for revaluation.   Hospital course:  Multifocal pneumonia Consistent with atypical pneumonia. Started empirically on Ceftriaxone and azithromycin. Procalcitonin elevated at 1.13. Leukocytosis of 11.5k. Tmax of 102.8 F on admission. No blood cultures obtained on admission. Sputum culture pending on discharge. RVP pending on discharge. Patient with resolved leukocytosis and no fevers. He was transitioned to Amoxicillin and azithromycin on discharge.   Sepsis Present on admission and secondary to above. Antibiotics as mentioned above. No blood cultures obtained on admission.   Emphysema Patient is a prior smoker. Diagnosis is based on imaging. Patient will need PFT as an outpatient. Currently without wheezing/dyspnea. Started on nicotine patch inpatient.   History of TB Previously treated. Quantiferon gold obtained on admission. Discussed ID and  recommendation is now to check AFB smear to rule out active infection although low suspicion for active infection.  Discharge Diagnoses:  Principal Problem:   Primary atypical pneumonia Active Problems:   Emphysema lung (HCC)   Atypical pneumonia   Sepsis [facility])    Discharge Instructions  Discharge Instructions     Call Hamilton for:  difficulty breathing, headache or visual disturbances   Complete by: As directed    Call Hamilton for:  temperature >100.4   Complete by: As directed    Increase activity slowly   Complete by: As directed       Allergies as of 12/02/2020   No Known Allergies      Medication List     STOP taking these medications    doxycycline 100 MG capsule Commonly known as: VIBRAMYCIN       TAKE these medications    acetaminophen 160 MG/5ML solution Commonly known as: TYLENOL Take 320 mg by mouth every 6 (six) hours as needed for fever.   albuterol 108 (90 Base) MCG/ACT inhaler Commonly known as: VENTOLIN HFA Inhale 2 puffs into the lungs every 6 (six) hours as needed for wheezing or shortness of breath.   amoxicillin 500 MG capsule Commonly known as: AMOXIL Take 1 capsule (500 mg total) by mouth 3 (three) times daily for 5 days.   azithromycin 500 MG tablet Commonly known as: ZITHROMAX Take 1 tablet (500 mg total) by mouth daily for 2 days.        No Known Allergies  Consultations: None   Procedures/Studies: DG Chest 2 View  Result Date: 11/30/2020 CLINICAL DATA:  Shortness of breath and fever EXAM: CHEST - 2 VIEW COMPARISON:  November 27, 2020 FINDINGS: The heart size and mediastinal contours are within normal limits. Left greater than  right multifocal interstitial opacities. No pleural effusion. No pneumothorax. Emphysematous change with pulmonary hyperinflation. The visualized skeletal structures are unremarkable. IMPRESSION: Chronic parenchymal lung changes with superimposed, multifocal interstitial opacities, suspicious for atypical/viral  infection. Electronically Signed   By: Kenneth Hamilton   On: 11/30/2020 18:01   DG Chest Portable 1 View  Result Date: 11/27/2020 CLINICAL DATA:  Fatigue, cough, and decreased appetite for a few days. EXAM: PORTABLE CHEST 1 VIEW COMPARISON:  None. FINDINGS: Heart size and pulmonary vascularity are normal. Emphysematous changes in the lungs. No airspace disease or consolidation. No pleural effusions. No pneumothorax. Mediastinal contours appear intact. IMPRESSION: Emphysematous changes in the lungs. No evidence of active pulmonary disease. Electronically Signed   By: Kenneth NievesWilliam  Stevens M.D.   On: 11/27/2020 20:27      Subjective: No cough. No chest pain. No dyspnea.  Discharge Exam: Vitals:   12/02/20 0612 12/02/20 0747  BP: 100/73 103/66  Pulse: 63 (!) 58  Resp: 17 18  Temp: (!) 97.5 F (36.4 C) 97.9 F (36.6 C)  SpO2: 100% 99%   Vitals:   12/01/20 2331 12/02/20 0231 12/02/20 0612 12/02/20 0747  BP: 105/77 103/68 100/73 103/66  Pulse: 78 (!) 55 63 (!) 58  Resp: 17 17 17 18   Temp: 98.4 F (36.9 C) (!) 97.5 F (36.4 C) (!) 97.5 F (36.4 C) 97.9 F (36.6 C)  TempSrc: Oral Oral  Oral  SpO2: 100% 100% 100% 99%  Weight:      Height:        General: Pt is alert, awake, not in acute distress Cardiovascular: RRR, S1/S2 +, no rubs, no gallops Respiratory: CTA bilaterally, no wheezing, no rhonchi Abdominal: Soft, NT, ND, bowel sounds + Extremities: no edema, no cyanosis    The results of significant diagnostics from this hospitalization (including imaging, microbiology, ancillary and laboratory) are listed below for reference.     Microbiology: Recent Results (from the past 240 hour(s))  Resp Panel by RT-PCR (Flu A&B, Covid) Nasopharyngeal Swab     Status: None   Collection Time: 11/27/20  6:59 PM   Specimen: Nasopharyngeal Swab; Nasopharyngeal(NP) swabs in vial transport medium  Result Value Ref Range Status   SARS Coronavirus 2 by RT PCR NEGATIVE NEGATIVE Final     Comment: (NOTE) SARS-CoV-2 target nucleic acids are NOT DETECTED.  The SARS-CoV-2 RNA is generally detectable in upper respiratory specimens during the acute phase of infection. The lowest concentration of SARS-CoV-2 viral copies this assay can detect is 138 copies/mL. A negative result does not preclude SARS-Cov-2 infection and should not be used as the sole basis for treatment or other patient management decisions. A negative result may occur with  improper specimen collection/handling, submission of specimen other than nasopharyngeal swab, presence of viral mutation(s) within the areas targeted by this assay, and inadequate number of viral copies(<138 copies/mL). A negative result must be combined with clinical observations, patient history, and epidemiological information. The expected result is Negative.  Fact Sheet for Patients:  BloggerCourse.comhttps://www.fda.gov/media/152166/download  Fact Sheet for Healthcare Providers:  SeriousBroker.ithttps://www.fda.gov/media/152162/download  This test is no t yet approved or cleared by the [place] FDA and  has been authorized for detection and/or diagnosis of SARS-CoV-2 by FDA under an Emergency Use Authorization (EUA). This EUA will remain  in effect (meaning this test can be used) for the duration of the COVID-19 declaration under Section 564(b)(1) of the Act, 21 U.S.C.section 360bbb-3(b)(1), unless the authorization is terminated  or revoked sooner.  Influenza A by PCR NEGATIVE NEGATIVE Final   Influenza B by PCR NEGATIVE NEGATIVE Final    Comment: (NOTE) The Xpert Xpress SARS-CoV-2/FLU/RSV plus assay is intended as an aid in the diagnosis of influenza from Nasopharyngeal swab specimens and should not be used as a sole basis for treatment. Nasal washings and aspirates are unacceptable for Xpert Xpress SARS-CoV-2/FLU/RSV testing.  Fact Sheet for Patients: BloggerCourse.com  Fact Sheet for Healthcare  Providers: SeriousBroker.it  This test is not yet approved or cleared by the [place] FDA and has been authorized for detection and/or diagnosis of SARS-CoV-2 by FDA under an Emergency Use Authorization (EUA). This EUA will remain in effect (meaning this test can be used) for the duration of the COVID-19 declaration under Section 564(b)(1) of the Act, 21 U.S.C. section 360bbb-3(b)(1), unless the authorization is terminated or revoked.  Performed at [facility], [address] [place], [place] [address]   Expectorated Sputum Assessment w Gram Stain, Rflx to Resp Cult     Status: None   Collection Time: 11/30/20 11:46 AM   Specimen: Expectorated Sputum  Result Value Ref Range Status   Specimen Description EXPECTORATED SPUTUM  Final   Special Requests Normal  Final   Sputum evaluation   Final    THIS SPECIMEN IS ACCEPTABLE FOR SPUTUM CULTURE Performed at [facility], [address], [place], [place] [address]    Report Status 12/02/2020 FINAL  Final  Culture, Respiratory w Gram Stain     Status: None (Preliminary result)   Collection Time: 11/30/20 11:46 AM  Result Value Ref Range Status   Specimen Description EXPECTORATED SPUTUM  Final   Special Requests Normal Reflexed from [id number]  Final   Gram Stain   Final    MODERATE WBC PRESENT,BOTH PMN AND MONONUCLEAR MODERATE GRAM VARIABLE ROD FEW GRAM POSITIVE COCCI IN PAIRS Performed at [facility], [address], [place], [place] [address]    Culture PENDING  Incomplete   Report Status PENDING  Incomplete  Resp Panel by RT-PCR (Flu A&B, Covid) Nasopharyngeal Swab     Status: None   Collection Time: 11/30/20  5:24 PM   Specimen: Nasopharyngeal Swab; Nasopharyngeal(NP) swabs in vial transport medium  Result Value Ref Range Status   SARS Coronavirus 2 by RT PCR NEGATIVE NEGATIVE Final    Comment: (NOTE) SARS-CoV-2 target nucleic acids are NOT DETECTED.  The SARS-CoV-2 RNA is  generally detectable in upper respiratory specimens during the acute phase of infection. The lowest concentration of SARS-CoV-2 viral copies this assay can detect is 138 copies/mL. A negative result does not preclude SARS-Cov-2 infection and should not be used as the sole basis for treatment or other patient management decisions. A negative result may occur with  improper specimen collection/handling, submission of specimen other than nasopharyngeal swab, presence of viral mutation(s) within the areas targeted by this assay, and inadequate number of viral copies(<138 copies/mL). A negative result must be combined with clinical observations, patient history, and epidemiological information. The expected result is Negative.  Fact Sheet for Patients:  BloggerCourse.com  Fact Sheet for Healthcare Providers:  SeriousBroker.it  This test is no t yet approved or cleared by the [place] FDA and  has been authorized for detection and/or diagnosis of SARS-CoV-2 by FDA under an Emergency Use Authorization (EUA). This EUA will remain  in effect (meaning this test can be used) for the duration of the COVID-19 declaration under Section 564(b)(1) of the Act, 21 U.S.C.section 360bbb-3(b)(1), unless  the authorization is terminated  or revoked sooner.       Influenza A by PCR NEGATIVE NEGATIVE Final   Influenza B by PCR NEGATIVE NEGATIVE Final    Comment: (NOTE) The Xpert Xpress SARS-CoV-2/FLU/RSV plus assay is intended as an aid in the diagnosis of influenza from Nasopharyngeal swab specimens and should not be used as a sole basis for treatment. Nasal washings and aspirates are unacceptable for Xpert Xpress SARS-CoV-2/FLU/RSV testing.  Fact Sheet for Patients: BloggerCourse.com  Fact Sheet for Healthcare Providers: SeriousBroker.it  This test is not yet approved or cleared by the [place] FDA and has been authorized for detection and/or diagnosis of SARS-CoV-2 by FDA under an Emergency Use Authorization (EUA). This EUA will remain in effect (meaning this test can be used) for the duration of the COVID-19 declaration under Section 564(b)(1) of the Act, 21 U.S.C. section 360bbb-3(b)(1), unless the authorization is terminated or revoked.  Performed at [facility], [address], [place], [place] [address]   Acid Fast Smear (AFB)     Status: None   Collection Time: 12/01/20 12:54 PM   Specimen: Sputum  Result Value Ref Range Status   AFB Specimen Processing Concentration  Final   Acid Fast Smear Negative  Final    Comment: (NOTE) Performed At: [facility] [address] [place], [place] [id number] Jolene Schimke Hamilton QQ:[id number]    Source (AFB) SPUTUM  Final    Comment: Performed at [facility], [address] [place], [place] [address]     Labs: BNP (last 3 results) No results for input(s): BNP in the last 8760 hours. Basic Metabolic Panel: Recent Labs  Lab 11/27/20 2352 11/30/20 1726 12/02/20 1035  NA 135 138 137  K 4.1 4.1 5.0  CL 105 103 109  CO2 20* 26 19*  GLUCOSE 122* 112* 62*  BUN 9 15 8   CREATININE 1.24 1.26* 0.98  CALCIUM 8.6* 9.0 8.3*   Liver Function Tests: Recent Labs  Lab 11/27/20 2352 11/30/20 1726  AST 35 40  ALT 21 29  ALKPHOS 38 42  BILITOT 0.6 0.8  PROT 6.8 7.4  ALBUMIN 3.6 3.4*   No results for input(s): LIPASE, AMYLASE in the last 168 hours. No results for input(s): AMMONIA in the last 168 hours. CBC: Recent Labs  Lab 11/27/20 2352 11/30/20 1726 12/02/20 1035  WBC 6.4 11.5* 6.8  NEUTROABS 4.1 9.8*  --   HGB 17.0 16.1 14.8  HCT 49.8 46.8 43.5  MCV 87.8 86.0 87.0  PLT PLATELET CLUMPS NOTED ON SMEAR, UNABLE TO ESTIMATE 153 203   Cardiac Enzymes: No results for input(s): CKTOTAL, CKMB, CKMBINDEX, TROPONINI in the last 168 hours. BNP: Invalid input(s): POCBNP CBG: No results for  input(s): GLUCAP in the last 168 hours. D-Dimer No results for input(s): DDIMER in the last 72 hours. Hgb A1c No results for input(s): HGBA1C in the last 72 hours. Lipid Profile No results for input(s): CHOL, HDL, LDLCALC, TRIG, CHOLHDL, LDLDIRECT in the last 72 hours. Thyroid function studies No results for input(s): TSH, T4TOTAL, T3FREE, THYROIDAB in the last 72 hours.  Invalid input(s): FREET3 Anemia work up No results for input(s): VITAMINB12, FOLATE, FERRITIN, TIBC, IRON, RETICCTPCT in the last 72 hours. Urinalysis    Component Value Date/Time   COLORURINE YELLOW 11/30/2020 2130   APPEARANCEUR CLEAR 11/30/2020 2130   LABSPEC 1.015 11/30/2020 2130   PHURINE 5.0 11/30/2020 2130   GLUCOSEU NEGATIVE 11/30/2020 2130   HGBUR SMALL (A) 11/30/2020 2130  BILIRUBINUR NEGATIVE 11/30/2020 2130   KETONESUR NEGATIVE 11/30/2020 2130   PROTEINUR NEGATIVE 11/30/2020 2130   NITRITE NEGATIVE 11/30/2020 2130   LEUKOCYTESUR MODERATE (A) 11/30/2020 2130   Sepsis Labs Invalid input(s): PROCALCITONIN,  WBC,  LACTICIDVEN Microbiology Recent Results (from the past 240 hour(s))  Resp Panel by RT-PCR (Flu A&B, Covid) Nasopharyngeal Swab     Status: None   Collection Time: 11/27/20  6:59 PM   Specimen: Nasopharyngeal Swab; Nasopharyngeal(NP) swabs in vial transport medium  Result Value Ref Range Status   SARS Coronavirus 2 by RT PCR NEGATIVE NEGATIVE Final    Comment: (NOTE) SARS-CoV-2 target nucleic acids are NOT DETECTED.  The SARS-CoV-2 RNA is generally detectable in upper respiratory specimens during the acute phase of infection. The lowest concentration of SARS-CoV-2 viral copies this assay can detect is 138 copies/mL. A negative result does not preclude SARS-Cov-2 infection and should not be used as the sole basis for treatment or other patient management decisions. A negative result may occur with  improper specimen collection/handling, submission of specimen other than nasopharyngeal  swab, presence of viral mutation(s) within the areas targeted by this assay, and inadequate number of viral copies(<138 copies/mL). A negative result must be combined with clinical observations, patient history, and epidemiological information. The expected result is Negative.  Fact Sheet for Patients:  BloggerCourse.com  Fact Sheet for Healthcare Providers:  SeriousBroker.it  This test is no t yet approved or cleared by the [place] FDA and  has been authorized for detection and/or diagnosis of SARS-CoV-2 by FDA under an Emergency Use Authorization (EUA). This EUA will remain  in effect (meaning this test can be used) for the duration of the COVID-19 declaration under Section 564(b)(1) of the Act, 21 U.S.C.section 360bbb-3(b)(1), unless the authorization is terminated  or revoked sooner.       Influenza A by PCR NEGATIVE NEGATIVE Final   Influenza B by PCR NEGATIVE NEGATIVE Final    Comment: (NOTE) The Xpert Xpress SARS-CoV-2/FLU/RSV plus assay is intended as an aid in the diagnosis of influenza from Nasopharyngeal swab specimens and should not be used as a sole basis for treatment. Nasal washings and aspirates are unacceptable for Xpert Xpress SARS-CoV-2/FLU/RSV testing.  Fact Sheet for Patients: BloggerCourse.com  Fact Sheet for Healthcare Providers: SeriousBroker.it  This test is not yet approved or cleared by the [place] FDA and has been authorized for detection and/or diagnosis of SARS-CoV-2 by FDA under an Emergency Use Authorization (EUA). This EUA will remain in effect (meaning this test can be used) for the duration of the COVID-19 declaration under Section 564(b)(1) of the Act, 21 U.S.C. section 360bbb-3(b)(1), unless the authorization is terminated or revoked.  Performed at [facility], [address] [place], [place] [address]   Expectorated  Sputum Assessment w Gram Stain, Rflx to Resp Cult     Status: None   Collection Time: 11/30/20 11:46 AM   Specimen: Expectorated Sputum  Result Value Ref Range Status   Specimen Description EXPECTORATED SPUTUM  Final   Special Requests Normal  Final   Sputum evaluation   Final    THIS SPECIMEN IS ACCEPTABLE FOR SPUTUM CULTURE Performed at [facility], [address] [place], [place] [address]    Report Status 12/02/2020 FINAL  Final  Culture, Respiratory w Gram Stain     Status: None (Preliminary result)   Collection Time: 11/30/20 11:46 AM  Result Value Ref Range Status   Specimen Description EXPECTORATED SPUTUM  Final  Special Requests Normal Reflexed from [id number]  Final   Gram Stain   Final    MODERATE WBC PRESENT,BOTH PMN AND MONONUCLEAR MODERATE GRAM VARIABLE ROD FEW GRAM POSITIVE COCCI IN PAIRS Performed at [facility], [address], [place], [place] [address]    Culture PENDING  Incomplete   Report Status PENDING  Incomplete  Resp Panel by RT-PCR (Flu A&B, Covid) Nasopharyngeal Swab     Status: None   Collection Time: 11/30/20  5:24 PM   Specimen: Nasopharyngeal Swab; Nasopharyngeal(NP) swabs in vial transport medium  Result Value Ref Range Status   SARS Coronavirus 2 by RT PCR NEGATIVE NEGATIVE Final    Comment: (NOTE) SARS-CoV-2 target nucleic acids are NOT DETECTED.  The SARS-CoV-2 RNA is generally detectable in upper respiratory specimens during the acute phase of infection. The lowest concentration of SARS-CoV-2 viral copies this assay can detect is 138 copies/mL. A negative result does not preclude SARS-Cov-2 infection and should not be used as the sole basis for treatment or other patient management decisions. A negative result may occur with  improper specimen collection/handling, submission of specimen other than nasopharyngeal swab, presence of viral mutation(s) within the areas targeted by this assay, and inadequate number of  viral copies(<138 copies/mL). A negative result must be combined with clinical observations, patient history, and epidemiological information. The expected result is Negative.  Fact Sheet for Patients:  BloggerCourse.com  Fact Sheet for Healthcare Providers:  SeriousBroker.it  This test is no t yet approved or cleared by the [place] FDA and  has been authorized for detection and/or diagnosis of SARS-CoV-2 by FDA under an Emergency Use Authorization (EUA). This EUA will remain  in effect (meaning this test can be used) for the duration of the COVID-19 declaration under Section 564(b)(1) of the Act, 21 U.S.C.section 360bbb-3(b)(1), unless the authorization is terminated  or revoked sooner.       Influenza A by PCR NEGATIVE NEGATIVE Final   Influenza B by PCR NEGATIVE NEGATIVE Final    Comment: (NOTE) The Xpert Xpress SARS-CoV-2/FLU/RSV plus assay is intended as an aid in the diagnosis of influenza from Nasopharyngeal swab specimens and should not be used as a sole basis for treatment. Nasal washings and aspirates are unacceptable for Xpert Xpress SARS-CoV-2/FLU/RSV testing.  Fact Sheet for Patients: BloggerCourse.com  Fact Sheet for Healthcare Providers: SeriousBroker.it  This test is not yet approved or cleared by the [place] FDA and has been authorized for detection and/or diagnosis of SARS-CoV-2 by FDA under an Emergency Use Authorization (EUA). This EUA will remain in effect (meaning this test can be used) for the duration of the COVID-19 declaration under Section 564(b)(1) of the Act, 21 U.S.C. section 360bbb-3(b)(1), unless the authorization is terminated or revoked.  Performed at [facility], [address] [place], [place] [address]   Acid Fast Smear (AFB)     Status: None   Collection Time: 12/01/20 12:54 PM   Specimen: Sputum  Result Value Ref  Range Status   AFB Specimen Processing Concentration  Final   Acid Fast Smear Negative  Final    Comment: (NOTE) Performed At: [facility] [address] [place], [place] [id number] Jolene Schimke Hamilton [id number]    Source (AFB) SPUTUM  Final    Comment: Performed at [facility], [address] [place], [place] [address]     Time coordinating discharge: 35 minutes  SIGNED:   Jacquelin Hawking, Hamilton [facility] 12/02/2020, 5:41 PM

## 2020-12-02 NOTE — Progress Notes (Addendum)
Resting and Ambulatory sat 94-96%. No SOB, no chest pain

## 2020-12-02 NOTE — Plan of Care (Signed)
  Problem: Education: Goal: Knowledge of General Education information will improve Description: Including pain rating scale, medication(s)/side effects and non-pharmacologic comfort measures 12/02/2020 1424 by Quintella Baton, RN Outcome: Adequate for Discharge 12/02/2020 1424 by Quintella Baton, RN Outcome: Progressing   Problem: Health Behavior/Discharge Planning: Goal: Ability to manage health-related needs will improve 12/02/2020 1424 by Quintella Baton, RN Outcome: Adequate for Discharge 12/02/2020 1424 by Quintella Baton, RN Outcome: Progressing   Problem: Clinical Measurements: Goal: Ability to maintain clinical measurements within normal limits will improve 12/02/2020 1424 by Quintella Baton, RN Outcome: Adequate for Discharge 12/02/2020 1424 by Quintella Baton, RN Outcome: Progressing Goal: Will remain free from infection 12/02/2020 1424 by Quintella Baton, RN Outcome: Adequate for Discharge 12/02/2020 1424 by Quintella Baton, RN Outcome: Progressing Goal: Diagnostic test results will improve 12/02/2020 1424 by Quintella Baton, RN Outcome: Adequate for Discharge 12/02/2020 1424 by Quintella Baton, RN Outcome: Progressing Goal: Respiratory complications will improve 12/02/2020 1424 by Quintella Baton, RN Outcome: Adequate for Discharge 12/02/2020 1424 by Quintella Baton, RN Outcome: Progressing Goal: Cardiovascular complication will be avoided 12/02/2020 1424 by Quintella Baton, RN Outcome: Adequate for Discharge 12/02/2020 1424 by Quintella Baton, RN Outcome: Progressing   Problem: Activity: Goal: Risk for activity intolerance will decrease 12/02/2020 1424 by Quintella Baton, RN Outcome: Adequate for Discharge 12/02/2020 1424 by Quintella Baton, RN Outcome: Progressing   Problem: Nutrition: Goal: Adequate nutrition will be maintained 12/02/2020 1424 by Quintella Baton, RN Outcome: Adequate for Discharge 12/02/2020 1424 by Quintella Baton, RN Outcome:  Progressing   Problem: Coping: Goal: Level of anxiety will decrease 12/02/2020 1424 by Quintella Baton, RN Outcome: Adequate for Discharge 12/02/2020 1424 by Quintella Baton, RN Outcome: Progressing   Problem: Elimination: Goal: Will not experience complications related to bowel motility 12/02/2020 1424 by Quintella Baton, RN Outcome: Adequate for Discharge 12/02/2020 1424 by Quintella Baton, RN Outcome: Progressing Goal: Will not experience complications related to urinary retention 12/02/2020 1424 by Quintella Baton, RN Outcome: Adequate for Discharge 12/02/2020 1424 by Quintella Baton, RN Outcome: Progressing   Problem: Pain Managment: Goal: General experience of comfort will improve 12/02/2020 1424 by Quintella Baton, RN Outcome: Adequate for Discharge 12/02/2020 1424 by Quintella Baton, RN Outcome: Progressing   Problem: Safety: Goal: Ability to remain free from injury will improve 12/02/2020 1424 by Quintella Baton, RN Outcome: Adequate for Discharge 12/02/2020 1424 by Quintella Baton, RN Outcome: Progressing   Problem: Skin Integrity: Goal: Risk for impaired skin integrity will decrease 12/02/2020 1424 by Quintella Baton, RN Outcome: Adequate for Discharge 12/02/2020 1424 by Quintella Baton, RN Outcome: Progressing

## 2020-12-02 NOTE — Progress Notes (Signed)
Continuous fluid  order discontinued per Dr Caleb Popp verbal order

## 2020-12-02 NOTE — Progress Notes (Signed)
[name] to be D/C'd  per MD order.  Discussed with the patient and all questions fully answered.  VSS, Skin clean, dry and intact without evidence of skin break down, no evidence of skin tears noted.  IV catheter discontinued intact. Site without signs and symptoms of complications. Dressing and pressure applied.  An After Visit Summary was printed and given to the patient. Patient received prescription.  D/c education completed with patient/family including follow up instructions, medication list, d/c activities limitations if indicated, with other d/c instructions as indicated by MD - patient able to verbalize understanding, all questions fully answered.   Patient instructed to return to ED, call 911, or call MD for any changes in condition.   Patient to be escorted via WC, and D/C home via private auto.

## 2020-12-02 NOTE — Discharge Instructions (Signed)
Kenneth Hamilton,  You were in the hospital with pneumonia. This could be viral, but it is also possible it is bacterial. You have improved on antibiotics. Please continue antibiotics on discharge as prescribed. Please follow-up with a primary care physician.

## 2020-12-03 LAB — ACID FAST SMEAR (AFB, MYCOBACTERIA): Acid Fast Smear: NEGATIVE

## 2020-12-04 LAB — RESPIRATORY PANEL BY PCR

## 2020-12-04 LAB — CULTURE, RESPIRATORY W GRAM STAIN
Culture: NORMAL
Special Requests: NORMAL

## 2020-12-05 LAB — QUANTIFERON-TB GOLD PLUS

## 2020-12-13 ENCOUNTER — Encounter: Payer: Self-pay | Admitting: Family Medicine

## 2021-01-14 LAB — ACID FAST CULTURE WITH REFLEXED SENSITIVITIES (MYCOBACTERIA): Acid Fast Culture: NEGATIVE

## 2021-01-30 LAB — ACID FAST CULTURE WITH REFLEXED SENSITIVITIES (MYCOBACTERIA): Acid Fast Culture: NEGATIVE
